# Patient Record
Sex: Male | Born: 2005 | Race: White | Hispanic: Yes | Marital: Single | Smoking: Never smoker
Health system: Southern US, Community
[De-identification: ages and names within clinical notes are randomized; demographics above are authoritative.]

## PROBLEM LIST (undated history)

## (undated) DIAGNOSIS — J45909 Unspecified asthma, uncomplicated: Secondary | ICD-10-CM

## (undated) DIAGNOSIS — R062 Wheezing: Secondary | ICD-10-CM

## (undated) DIAGNOSIS — J309 Allergic rhinitis, unspecified: Secondary | ICD-10-CM

## (undated) DIAGNOSIS — T7840XA Allergy, unspecified, initial encounter: Secondary | ICD-10-CM

## (undated) HISTORY — DX: Wheezing: R06.2

## (undated) HISTORY — DX: Allergy, unspecified, initial encounter: T78.40XA

## (undated) HISTORY — DX: Allergic rhinitis, unspecified: J30.9

## (undated) HISTORY — PX: TONSILLECTOMY: SUR1361

## (undated) HISTORY — DX: Unspecified asthma, uncomplicated: J45.909

---
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
## 2023-02-20 ENCOUNTER — Ambulatory Visit (INDEPENDENT_AMBULATORY_CARE_PROVIDER_SITE_OTHER): Payer: Medicaid Other | Admitting: Pediatrics

## 2023-02-20 ENCOUNTER — Other Ambulatory Visit: Payer: Self-pay

## 2023-02-20 VITALS — HR 78 | Temp 98.0°F | Wt 180.6 lb

## 2023-02-20 DIAGNOSIS — Q676 Pectus excavatum: Secondary | ICD-10-CM | POA: Diagnosis not present

## 2023-02-20 DIAGNOSIS — R6889 Other general symptoms and signs: Secondary | ICD-10-CM | POA: Diagnosis not present

## 2023-02-20 DIAGNOSIS — R0789 Other chest pain: Secondary | ICD-10-CM | POA: Diagnosis not present

## 2023-02-20 NOTE — Patient Instructions (Addendum)
Thanks for coming in [place].   These are a couple things we want to focus on for your chest: We are referring you to the hear specialist (Cardiologist) for evaluation with likely an EKG to look at your heart rhythm and echocardiogram (or ultrasound of your heart) -- you will get a call to schedule this appointment We are going to get a chest x-ray, we will call you with results Eliminate the following from your diet: Caffeine from any source (coffee, energy drinks, etc) Creatine Fast food Continue to avoid all tobacco products You may try the following medications to see if they help with the discomfort Tums (generic is Calcium Carbonate) 1-4 tablets as needed Prilosec(generic is Omeprazole) 10mg  tablet or capsule, once daily, trial for 4-8 weeks

## 2023-02-20 NOTE — Progress Notes (Signed)
History was provided by the patient.  Jack Anderson is a 17 y.o. male who is here for chest discomfort.     HPI:  Per Jack Anderson, started coffee/energy drinks last September. Had feelings of heart beating out of his chest anytime he would have caffeine. Energy drinks 4-6 times per month. Taking creatine every day since 2020.   Felt as if he was getting tired during warm-ups for basketball and this was not normal for him. His mile time went from 8-9 minutes to 13-15 minutes. Also feels like he is short of breath quicker. Has feel like his stamina is on a decline.   Also, when he ate certain foods, like fast food burgers, he would have discomfort in his chest. Was on slightly left side of chest underneath breastbone. Occur 3-4x No acid taste in his mouth. Would go away within minutes. Also would occur at nighttime. Seems to occur mostly on Friday nights, whether he goes to gym or not. Non-radiating. Not necessarily exacerbated by activity.   No syncopal episodes. Has had period where he was dizzy and he thought he was going to black out.   Has a history of pectus excavatum. Personal history of friend with similar symptoms and had to have surgery so concerned.   No recent unintended weight loss or gain. No difficulty or pain with swallowing. No vomiting. No diaphoretic episodes.   No tobacco/vaping, drugs, alcohol use.   Has been using a vacuum belt to try to improve his pectus. Has caused a bruise over chest.   The following portions of the patient's history were reviewed and updated as appropriate: allergies, current medications, past family history, past medical history, past social history, past surgical history, and problem list.  Patient Active Problem List   Diagnosis Date Noted   Acne 08/16/2019   Tinea pedis of right foot 08/16/2019   Pectus excavatum 08/16/2019   Influenza vaccination declined 03/10/2018    UTD imms  No Fhx of heart disease, high cholesterol,  stroke, MI, high blood pressure.   Physical Exam:  Pulse 78   Temp 98 F (36.7 C) (Oral)   Wt 180 lb 9.6 oz (81.9 kg)   SpO2 100%   Blood pressure %iles are not available for patients who are 18 years or older.  General: Awake, alert, appropriately responsive in NAD HEENT: NCAT. EOMI, PERRL, clear sclera and conjunctiva, corneal light reflex symmetric.Clear nares bilaterally. Oropharynx clear with no tonsillar enlargment or exudates. MMM.  Neck: Supple. No thyromegaly appreciated.  Lymph Nodes: No palpable lymphadenopathy.  CV: RRR, normal S1, S2. No murmur appreciated. 2+ distal pulses.  Chest: Mild sternal depression. Small amount of ecchymoses over depression.  Pulm: Normal WOB. CTAB with good aeration throughout.  No focal W/R/R.  Abd: Normoactive bowel sounds. Soft, non-tender, non-distended. No HSM appreciated. MSK: Extremities WWP. Moves all extremities equally.  Neuro: Appropriately responsive to stimuli. Normal bulk and tone. No gross deficits appreciated. Skin: No rashes or lesions appreciated. Cap refill < 2 seconds.      Assessment/Plan:   1. Chest discomfort 2. Exercise intolerance 3. Pectus excavatum 16yo M with PMH of mild pectus excavatum presenting with chronic 1-2 year history of nonspecific chest discomfort as well as worsening exercise intolerance. No evidence of worsening pectus on exam. May be element of reflux contributing to chest discomfort but does not explain developing exercise intolerance. Counseled on lifestyle changes as well as trial of PPI to determine if any improvement. Will also refer to Peds  Cardiology for further evaluation and likely EKG and Echo. Plan to obtain CXR to evaluate if any evidence of cardiomegaly, but reassuringly has a normal CV exam in clinic. Has a PMH of childhood asthma so could be element of exercise induced bronchospasm. Patient is also interested in cosmetic surgery for Pectus. No plan for bronchodilator trial. No indication  for chest CT at this time. Plan to continue to follow up after cardiology evaluation. May consider PFTs and trial of bronchodilator if no cardiovascular etiology.  Gave return to care precautions.   - Ambulatory referral to Pediatric Cardiology  - DG Chest 2 View; Future   J. Chestine Spore, MD, MPH [facility] - Primary Care PGY-2   02/20/23

## 2023-02-23 ENCOUNTER — Other Ambulatory Visit: Payer: Self-pay | Admitting: Pediatrics

## 2023-02-23 DIAGNOSIS — R6889 Other general symptoms and signs: Secondary | ICD-10-CM

## 2023-02-25 ENCOUNTER — Encounter: Payer: Self-pay | Admitting: Pediatrics

## 2023-02-25 ENCOUNTER — Ambulatory Visit
Admission: RE | Admit: 2023-02-25 | Discharge: 2023-02-25 | Disposition: A | Discharge status: Home / Self Care | Payer: Medicaid Other | Source: Ambulatory Visit | Attending: Pediatrics | Admitting: Pediatrics

## 2023-02-25 DIAGNOSIS — R0789 Other chest pain: Secondary | ICD-10-CM | POA: Diagnosis not present

## 2023-02-25 DIAGNOSIS — Q676 Pectus excavatum: Secondary | ICD-10-CM

## 2023-02-25 DIAGNOSIS — R6889 Other general symptoms and signs: Secondary | ICD-10-CM

## 2023-04-22 ENCOUNTER — Ambulatory Visit: Payer: Medicaid Other | Attending: Cardiology | Admitting: Cardiology

## 2023-04-22 ENCOUNTER — Encounter: Payer: Self-pay | Admitting: Cardiology

## 2023-04-22 VITALS — BP 96/58 | HR 82 | Ht 69.0 in | Wt 170.4 lb

## 2023-04-22 DIAGNOSIS — R002 Palpitations: Secondary | ICD-10-CM | POA: Diagnosis not present

## 2023-04-22 DIAGNOSIS — R6889 Other general symptoms and signs: Secondary | ICD-10-CM | POA: Diagnosis not present

## 2023-04-22 DIAGNOSIS — Q676 Pectus excavatum: Secondary | ICD-10-CM

## 2023-04-22 DIAGNOSIS — R0602 Shortness of breath: Secondary | ICD-10-CM | POA: Diagnosis not present

## 2023-04-22 NOTE — Patient Instructions (Signed)
Medication Instructions:  The current medical regimen is effective;  continue present plan and medications.  *If you need a refill on your cardiac medications before your next appointment, please call your pharmacy*  Testing/Procedures: Your physician has requested that you have an echocardiogram. Echocardiography is a painless test that uses sound waves to create images of your heart. It provides your doctor with information about the size and shape of your heart and how well your heart's chambers and valves are working. This procedure takes approximately one hour. There are no restrictions for this procedure. Please do NOT wear cologne, perfume, aftershave, or lotions (deodorant is allowed). Please arrive 15 minutes prior to your appointment time.  Follow-Up: At [facility], you and your health needs are our priority.  As part of our continuing mission to provide you with exceptional heart care, we have created designated Provider Care Teams.  These Care Teams include your primary Cardiologist (physician) and Advanced Practice Providers (APPs -  Physician Assistants and Nurse Practitioners) who all work together to provide you with the care you need, when you need it.  We recommend signing up for the patient portal called "MyChart".  Sign up information is provided on this After Visit Summary.  MyChart is used to connect with patients for Virtual Visits (Telemedicine).  Patients are able to view lab/test results, encounter notes, upcoming appointments, etc.  Non-urgent messages can be sent to your provider as well.   To learn more about what you can do with MyChart, go to [email or web address].    Your next appointment:   Follow up will be based on the results of the above testing.   

## 2023-04-22 NOTE — Progress Notes (Signed)
Cardiology Office Note:    Date:  04/22/2023   ID:  Jack Anderson, DOB [birth year], MRN [id number]  PCP:  Theadore Nan, MD   [facility] Providers Cardiologist:  None     Referring MD: Kathi Simpers, MD    History of Present Illness:    Jack Anderson is a 17 y.o. male here for the evaluation of chest discomfort exercise intolerance at the request of Dr. Kathlene November  In review of office visit from 02/20/2023, he had feelings of palpitations, heart beating out of his chest anytime he would have caffeine.  He would drink an energy drink maybe 4-6 times per month.  Been taking creatine also since 2020.  When playing basketball, felt quite tired during warm ups said this was not normal.  His mile time also declined; quite significant shortness of breath.  Stamina on decline.  When he would eat fast food, sometimes would have chest discomfort.  Left side underneath his breastbone.  Usually would go away within minutes.  Has pectus excavatum.  Had a friend with similar symptoms and then ended up needing surgery.   Past Medical History:  Diagnosis Date   Allergic rhinitis    Allergy    Phreesia 12/17/2020   Asthma    no meds 2010 to 01/2014   Wheezing 01/25/2014    Past Surgical History:  Procedure Laterality Date   APPENDECTOMY  1574   17 year old   TONSILLECTOMY      Current Medications: No outpatient medications have been marked as taking for the 04/22/23 encounter (Office Visit) with Jake Bathe, MD.     Allergies:   Amoxil [amoxicillin]   Social History   Socioeconomic History   Marital status: Single    Spouse name: Not on file   Number of children: Not on file   Years of education: Not on file   Highest education level: Not on file  Occupational History   Not on file  Tobacco Use   Smoking status: Never   Smokeless tobacco: Never  Substance and Sexual Activity   Alcohol use: Not on file   Drug use: Not on file    Sexual activity: Not on file  Other Topics Concern   Not on file  Social History Narrative   ** Merged History Encounter **       Social Determinants of Health   Financial Resource Strain: Not on file  Food Insecurity: Not on file  Transportation Needs: Not on file  Physical Activity: Not on file  Stress: Not on file  Social Connections: Not on file     Family History: The patient's family history is negative for Asthma.  ROS:   Please see the history of present illness.     All other systems reviewed and are negative.  EKGs/Labs/Other Studies Reviewed:    The following studies were reviewed today: EKG reviewed as below.  Prior office notes reviewed.  EKG:  The ekg ordered today demonstrates EKG Interpretation Date/Time:  Wednesday April 22 2023 16:11:07 EDT Ventricular Rate:  71 PR Interval:  174 QRS Duration:  98 QT Interval:  376 QTC Calculation: 408 R Axis:   215  Text Interpretation: Normal sinus rhythm Right superior axis deviation No previous ECGs available Confirmed by Donato Schultz ([id number]) on 04/22/2023 4:18:26 PM    Recent Labs: No results found for requested labs within last 365 days.  Recent Lipid Panel No results found for: "CHOL", "TRIG", "HDL", "CHOLHDL", "VLDL", "LDLCALC", "  LDLDIRECT"   Risk Assessment/Calculations:               Physical Exam:    VS:  BP (!) 96/58   Pulse 82   Ht 5\' 9"  (1.753 m)   Wt 170 lb 6.4 oz (77.3 kg)   SpO2 97%   BMI 25.16 kg/m     Wt Readings from Last 3 Encounters:  04/22/23 170 lb 6.4 oz (77.3 kg) (75%, Z= 0.68)*  02/20/23 180 lb 9.6 oz (81.9 kg) (84%, Z= 1.01)*  04/29/22 178 lb (80.7 kg) (85%, Z= 1.05)*   * Growth percentiles are based on CDC (Boys, 2-20 Years) data.     GEN:  Well nourished, well developed in no acute distress HEENT: Normal NECK: No JVD; No carotid bruits LYMPHATICS: No lymphadenopathy CARDIAC: RRR, no murmurs, rubs, gallops RESPIRATORY:  Clear to auscultation without rales,  wheezing or rhonchi  ABDOMEN: Soft, non-tender, non-distended MUSCULOSKELETAL:  No edema; No deformity  SKIN: Warm and dry NEUROLOGIC:  Alert and oriented x 3 PSYCHIATRIC:  Normal affect   ASSESSMENT:    1. Exercise intolerance   2. Pectus excavatum   3. Shortness of breath   4. Palpitations    PLAN:    In order of problems listed above:  Pectus excavatum Shortness of breath/decreased exercise tolerance -With pectus excavatum, lung restriction can take place resulting in decreased exercise tolerance.  We will check an echocardiogram to ensure proper structure and function of his heart as occasionally these can be present in the setting of pectus excavatum.  Based upon his symptoms, I would recommend further evaluation by thoracic surgery specialist, perhaps at [facility] for potential revision of pectus excavatum based upon his symptoms.  Palpitations - This has improved since decreasing energy drinks/caffeine.  He is also stopped creatine and has lost 10 pounds.  This was mostly water weight.  Chest discomfort - After eating burgers for instance.  Likely secondary to gastric reflux.  Could consider Pepcid.  This appears noncardiac.          Medication Adjustments/Labs and Tests Ordered: Current medicines are reviewed at length with the patient today.  Concerns regarding medicines are outlined above.  Orders Placed This Encounter  Procedures   EKG 12-Lead   ECHOCARDIOGRAM COMPLETE   No orders of the defined types were placed in this encounter.   Patient Instructions  Medication Instructions:  The current medical regimen is effective;  continue present plan and medications.  *If you need a refill on your cardiac medications before your next appointment, please call your pharmacy*  Testing/Procedures: Your physician has requested that you have an echocardiogram. Echocardiography is a painless test that uses sound waves to create images of your heart. It provides your doctor  with information about the size and shape of your heart and how well your heart's chambers and valves are working. This procedure takes approximately one hour. There are no restrictions for this procedure. Please do NOT wear cologne, perfume, aftershave, or lotions (deodorant is allowed). Please arrive 15 minutes prior to your appointment time.  Follow-Up: At [facility], you and your health needs are our priority.  As part of our continuing mission to provide you with exceptional heart care, we have created designated Provider Care Teams.  These Care Teams include your primary Cardiologist (physician) and Advanced Practice Providers (APPs -  Physician Assistants and Nurse Practitioners) who all work together to provide you with the care you need, when you need it.  We recommend  signing up for the patient portal called "MyChart".  Sign up information is provided on this After Visit Summary.  MyChart is used to connect with patients for Virtual Visits (Telemedicine).  Patients are able to view lab/test results, encounter notes, upcoming appointments, etc.  Non-urgent messages can be sent to your provider as well.   To learn more about what you can do with MyChart, go to [email or web address].    Your next appointment:   Follow up will be based on the results of the above testing.   Signed, Donato Schultz, MD  04/22/2023 4:45 PM    [facility]

## 2023-04-27 ENCOUNTER — Encounter: Payer: Self-pay | Admitting: Pediatrics

## 2023-04-27 ENCOUNTER — Ambulatory Visit (INDEPENDENT_AMBULATORY_CARE_PROVIDER_SITE_OTHER): Payer: Medicaid Other | Admitting: Pediatrics

## 2023-04-27 VITALS — BP 118/78 | Wt 169.1 lb

## 2023-04-27 DIAGNOSIS — R0789 Other chest pain: Secondary | ICD-10-CM | POA: Diagnosis not present

## 2023-04-27 DIAGNOSIS — R6889 Other general symptoms and signs: Secondary | ICD-10-CM

## 2023-04-27 DIAGNOSIS — Q676 Pectus excavatum: Secondary | ICD-10-CM

## 2023-04-27 DIAGNOSIS — J4520 Mild intermittent asthma, uncomplicated: Secondary | ICD-10-CM | POA: Diagnosis not present

## 2023-04-27 MED ORDER — SYMBICORT 80-4.5 MCG/ACT IN AERO: 2.0000 | INHALATION_SPRAY | Freq: Two times a day (BID) | RESPIRATORY_TRACT | 12 refills | Status: AC

## 2023-04-27 NOTE — Progress Notes (Signed)
Subjective:     Jack Anderson, is a 17 y.o. male  HPI  Chief Complaint  Patient presents with   Follow-up    Patient states he has changed his diet to no caffeine and said he is feeling much better     Presented to the clinic in June with decreased exercise tolerance, chest discomfort, symptoms of GE reflux and palpitation.  Evaluation has included chest x-ray and cardiology has not yet had his echo The palpitations stop with decrease in the energy drinks No creatine No energy drinks Pain in chest with eating; No more pain in chest after eating  Endurance is still an issues Ultrasound--scheduled for end of August  Mile time used to be 7-8 minutes Mile time 13 minutes Still run 30 min 3 times a week Was ding more (also had basketball)   No cough with running or after running,  No chest pain while running 2019 treated for fatigue and cough with Albuterol -  He is very self-conscious about his pectus excavatum He wants it repaired by one of the top 3-5 surgeons in the world He would consider leaving the country to have it done He says the best surgeon in the countries in [place] He says his parents say his concerns on his head and that they would not think he should have surgery He has been researching surgical procedures on the Internet and he has a friend who had in his 30s.  Seasonal allergies Rare, occasional runny nose or eye if heavy pollen  Graduate high school  History and Problem List: Jack Anderson has Influenza vaccination declined; Acne; Tinea pedis of right foot; and Pectus excavatum on their problem list.  Jack Anderson  has a past medical history of Allergic rhinitis, Allergy, Asthma, and Wheezing (01/25/2014).     Objective:     BP 118/78 (BP Location: Left Arm)   Wt 169 lb 2 oz (76.7 kg)   BMI 24.98 kg/m   Physical Exam Constitutional:      Appearance: He is normal weight.     Comments: Very muscular  HENT:     Head: Normocephalic.      Mouth/Throat:     Mouth: Mucous membranes are moist.     Pharynx: Oropharynx is clear.  Cardiovascular:     Heart sounds: Normal heart sounds. No murmur heard. Pulmonary:     Effort: Pulmonary effort is normal.     Breath sounds: Normal breath sounds.  Chest:     Comments: Moderate pectus excavatum Abdominal:     General: Abdomen is flat. There is no distension.     Tenderness: There is no abdominal tenderness.  Skin:    Coloration: Skin is not pale.  Neurological:     Mental Status: He is alert.        Assessment & Plan:   1. Exercise intolerance  He says that he has not changed his workout routines or his running schedule based decrease in his running time. He has a history of asthma that was actively treated recently as 2019.  Trial of Symbicort 2 puffs twice a day with a spacer He can also use his Symbicort before exercise  - budesonide-formoterol (SYMBICORT) 80-4.5 MCG/ACT inhaler; Inhale 2 puffs into the lungs 2 (two) times daily.  Dispense: 10.2 g; Refill: 12  2. Pectus excavatum  Discussed with patient that his insurance would not cover surgery outside of [place]. All surgery has risks of pain and infection It is unlikely that his  pectus is causing any significant change in his exercise tolerance as his pectus is not changed much over the last several years. Okay to refer to thoracic surgery, but he should make sure his insurance will pay for the visit and to be clear that his wants to discuss repair of his pectus  - Ambulatory referral to Cardiothoracic Surgery  3. Chest discomfort He is no longer having palpitations now but is stopped taking creatine and energy drinks. He is also no longer having the symptoms that seem consistent with heartburn   Supportive care and return precautions reviewed.  Time spent reviewing chart in preparation for visit:  5 minutes Time spent face-to-face with patient: 15 minutes Time spent not face-to-face with  patient for documentation and care coordination on date of service: 5 minutes   Theadore Nan, MD

## 2023-04-27 NOTE — Patient Instructions (Signed)
Adult Primary Care Clinics Name Criteria Services   [facility]  Address: [address] [place], [place] [address]  Phone: [phone] Hours: Monday - Friday 9 AM -6 PM  Types of insurance accepted:  Commercial insurance [facility] (orange card) Medicaid Medicare Uninsured  Language services:  Video and phone interpreters available   Ages 18 and older    Adult primary care Onsite pharmacy Integrated behavioral health Financial assistance counseling Walk-in hours for established patients  Financial assistance counseling hours: Tuesdays 2:00PM - 5:00PM  Thursday 8:30AM - 4:30PM  Space is limited, 10 on Tuesday and 20 on Thursday. It's on first come first serve basis  Name Criteria Services   [facility]  Address: [address] [place], [place] [address]  Phone: [phone]  Hours: Monday - Friday 8:30 AM - 5 PM  Types of insurance accepted:  Commercial insurance Medicaid Medicare Uninsured  Language services:  Video and phone interpreters available   All ages - newborn to adult   Primary care for all ages (children and adults) Integrated behavioral health Nutritionist Financial assistance counseling   Name Criteria Services   [facility]  Located on the ground floor of [facility]  Address: [address]  [place],  [place]  [address]  Phone: [phone]  Hours: Monday - Friday 8:15 AM - 5 PM  Types of insurance accepted:  Commercial insurance Medicaid Medicare Uninsured  Language services:  Video and phone interpreters available   Ages 18 and older   Adult primary care Nutritionist Certified Diabetes Educator  Integrated behavioral health Financial assistance counseling   Name Criteria Services   [facility]  Address: [address] [place], [place] [address]  Phone:  [phone]  Hours: Monday - Friday 8:30 AM - 5 PM    Types of insurance accepted:  Commercial insurance Medicaid Medicare Uninsured  Language services:  Video and phone interpreters available   All ages - newborn to adult   Primary care for all ages (children and adults) Integrated behavioral health Financial assistance counseling      

## 2023-05-11 ENCOUNTER — Ambulatory Visit (HOSPITAL_COMMUNITY): Payer: Medicaid Other | Attending: Cardiology

## 2023-05-11 DIAGNOSIS — R6889 Other general symptoms and signs: Secondary | ICD-10-CM | POA: Insufficient documentation

## 2023-05-11 DIAGNOSIS — R0609 Other forms of dyspnea: Secondary | ICD-10-CM | POA: Diagnosis not present

## 2023-05-11 DIAGNOSIS — Q676 Pectus excavatum: Secondary | ICD-10-CM | POA: Insufficient documentation

## 2023-05-11 LAB — ECHOCARDIOGRAM COMPLETE
Area-P 1/2: 4.1 cm2
Est EF: >75
S' Lateral: 2.8 cm

## 2023-05-14 ENCOUNTER — Encounter: Payer: Self-pay | Admitting: Pediatrics

## 2023-06-15 ENCOUNTER — Encounter: Payer: Self-pay | Admitting: Pediatrics

## 2023-06-15 ENCOUNTER — Ambulatory Visit (INDEPENDENT_AMBULATORY_CARE_PROVIDER_SITE_OTHER): Payer: Medicaid Other | Admitting: Pediatrics

## 2023-06-15 ENCOUNTER — Other Ambulatory Visit (HOSPITAL_COMMUNITY)
Admission: RE | Admit: 2023-06-15 | Discharge: 2023-06-15 | Disposition: A | Discharge status: Home / Self Care | Payer: Medicaid Other | Source: Ambulatory Visit | Attending: Pediatrics | Admitting: Pediatrics

## 2023-06-15 VITALS — BP 110/60 | HR 74 | Ht 68.82 in | Wt 171.1 lb

## 2023-06-15 DIAGNOSIS — Z113 Encounter for screening for infections with a predominantly sexual mode of transmission: Secondary | ICD-10-CM | POA: Insufficient documentation

## 2023-06-15 DIAGNOSIS — Z1389 Encounter for screening for other disorder: Secondary | ICD-10-CM | POA: Diagnosis not present

## 2023-06-15 DIAGNOSIS — Z1331 Encounter for screening for depression: Secondary | ICD-10-CM

## 2023-06-15 DIAGNOSIS — Q676 Pectus excavatum: Secondary | ICD-10-CM | POA: Diagnosis not present

## 2023-06-15 DIAGNOSIS — Z6825 Body mass index (BMI) 25.0-25.9, adult: Secondary | ICD-10-CM | POA: Diagnosis not present

## 2023-06-15 DIAGNOSIS — Z114 Encounter for screening for human immunodeficiency virus [HIV]: Secondary | ICD-10-CM

## 2023-06-15 DIAGNOSIS — Z0001 Encounter for general adult medical examination with abnormal findings: Secondary | ICD-10-CM | POA: Diagnosis not present

## 2023-06-15 DIAGNOSIS — Z00129 Encounter for routine child health examination without abnormal findings: Secondary | ICD-10-CM | POA: Diagnosis not present

## 2023-06-15 DIAGNOSIS — Z0000 Encounter for general adult medical examination without abnormal findings: Secondary | ICD-10-CM | POA: Diagnosis not present

## 2023-06-15 DIAGNOSIS — E663 Overweight: Secondary | ICD-10-CM | POA: Diagnosis not present

## 2023-06-15 LAB — POCT RAPID HIV: Rapid HIV, POC: NEGATIVE

## 2023-06-15 NOTE — Progress Notes (Signed)
Adolescent Well Care Visit Jack Anderson is a 17 y.o. male who is here for well care.    PCP:  Jack Nan, MD  Interpreter used: no   History was provided by the patient.  Current Issues:  .   Last well visit 07/2021  Meeting a new adult doctor--something [facility]  He called [facility] Surgery at [facility], he wants pectus repair although his parents are discouraging it. --the surgeons said that they didn't have a referral although it is completed in our chart   Regarding chest discomfort No more Pepcid  Tried Symbicort and no change Not having coughing, just having chest tightness--does have hx of childhood asthma   Saw Cardiology "everything fine " 8/7 EKG ordered, results not found Echo normal   Stopped caffeine--was using energy drinks and having palpitations  Stopped creatine  Nutrition: Current Diet: eats healthy, fruit and veg, and calcium  Exercise/ Media: Sports?/ Exercise: lifting and cardio (treadmill)  Media: hours per day: tries to limit Media Rules or Monitoring?: yes  Sleep:  Sleep: no problems Problems Sleeping: No  Social Screening: Lives with:  family:  Jack Anderson 14, Jack Anderson is 2 Interests/ Activities: guitar, hang with friends Work, and Regulatory affairs officer?: not working Concerns regarding behavior? no   Education: School Name and Grade: a Holiday representative in college Was in [employer],  At [employer],  Biology major--want to go to dental school  Problems: none  Dental Patient has a dental home: yes  Confidential Social History: Tobacco?  no Cannabis? no Alcohol? no Was dating someone, a woman Condoms, plan B  Sexually Active?  Previously, not currently     Screenings: The patient completed the Rapid Assessment for Adolescent Preventive Services screening questionnaire and the following topics were identified as risk factors and discussed:  supplement use    PHQ-9, modified for Adolescents  completed and results indicated  low risk score of 0  Physical Exam:  Vitals:   06/15/23 0905  BP: 110/60  Pulse: 74  SpO2: 98%  Weight: 171 lb 2 oz (77.6 kg)  Height: 5' 8.82" (1.748 m)   BP 110/60 (BP Location: Left Arm, Patient Position: Sitting, Cuff Size: Normal)   Pulse 74   Ht 5' 8.82" (1.748 m)   Wt 171 lb 2 oz (77.6 kg)   SpO2 98%   BMI 25.40 kg/m  Body mass index: body mass index is 25.4 kg/m. Blood pressure %iles are not available for patients who are 18 years or older.  Hearing Screening   500Hz  1000Hz  2000Hz  4000Hz   Right ear 20 20 20 20   Left ear 20 20 20 20    Vision Screening   Right eye Left eye Both eyes  Without correction 20/16 20/16 20/16   With correction       General Appearance:   alert, oriented, no acute distress  HENT: Normocephalic, no obvious abnormality, conjunctiva clear  Mouth:   Normal appearing teeth,no  untreated dental caries,   Neck:   Supple; thyroid: no enlargement, symmetric, no tenderness/mass/nodules  Chest pectus  Lungs:   Clear to auscultation bilaterally, normal work of breathing  Heart:   Regular rate and rhythm, S1 and S2 normal, no murmurs;   Abdomen:   Soft, non-tender, no mass, or organomegaly  GU normal male genitals, no testicular masses or hernia  Musculoskeletal:   Tone and strength strong and symmetrical, all extremities               Lymphatic:   No  cervical adenopathy  Skin/Hair/Nails:   Skin warm, dry and intact, no rashes, no bruises or petechiae  Skin-Acne:  Mild acne face and chest  Neurologic:   Strength, gait, and coordination normal and age-appropriate     Assessment and Plan:   1. Encounter for routine child health examination without abnormal findings   2. Screening examination for venereal disease pending - Urine cytology ancillary only  3. Screening for human immunodeficiency virus negative - POCT Rapid HIV  4. Overweight with body mass index (BMI) of 25 to 25.9 in adult Has athletic build  Pectus --previously  referred to CT surgery fot consultation Wil ask referral coordinator to assist He can also  to call CT surgery to FU on referral   Growth: Appropriate growth for age  BMI is appropriate for age  Concerns regarding school: No  Concerns regarding home: No  Hearing screening result:normal Vision screening result: normal  Imm UTD   FU at adult primary care   Jack Nan, MD

## 2023-06-15 NOTE — Patient Instructions (Addendum)
Please call Patient referred to [facility] Cardiothoracic S[facility]-   [facility] [place], [place], [place] [address] p. [phone] Edilia Bo [phone]   Please call our office, [facility]chelle Folks, if still having difficulty with referral

## 2023-06-16 LAB — URINE CYTOLOGY ANCILLARY ONLY
Chlamydia: NEGATIVE
Comment: NEGATIVE
Comment: NORMAL
Neisseria Gonorrhea: NEGATIVE

## 2023-06-18 NOTE — Addendum Note (Signed)
Addended by: Theadore Nan on: 06/18/2023 03:55 PM   Modules accepted: Orders

## 2023-07-09 ENCOUNTER — Ambulatory Visit
Admission: EM | Admit: 2023-07-09 | Discharge: 2023-07-09 | Disposition: A | Discharge status: Home / Self Care | Payer: Medicaid Other | Attending: Emergency Medicine | Admitting: Emergency Medicine

## 2023-07-09 DIAGNOSIS — J302 Other seasonal allergic rhinitis: Secondary | ICD-10-CM | POA: Diagnosis not present

## 2023-07-09 DIAGNOSIS — J029 Acute pharyngitis, unspecified: Secondary | ICD-10-CM | POA: Diagnosis not present

## 2023-07-09 LAB — POCT RAPID STREP A (OFFICE): Rapid Strep A Screen: NEGATIVE

## 2023-07-09 MED ORDER — CETIRIZINE HCL 10 MG PO TABS: 10.0000 mg | ORAL_TABLET | Freq: Every day | ORAL | 0 refills | Status: AC

## 2023-07-09 NOTE — ED Triage Notes (Signed)
Pt reports sore throat the last three mornings, goes away in the afternoon. Also has slightly runny nose these times as well.  Denies fever, cough, SOB, etc.

## 2023-07-09 NOTE — Discharge Instructions (Addendum)
Take Zyrtec as directed.    Take Tylenol or ibuprofen as needed for discomfort.    Follow-up with your primary care provider if your symptoms are not improving.

## 2023-07-09 NOTE — ED Provider Notes (Signed)
[facility] Jack Anderson    CSN: [id number] Arrival date & time: 07/09/23  1020      History   Chief Complaint Chief Complaint  Patient presents with   Sore Throat    Intermittent x 3 days     HPI Jack Anderson is a 17 y.o. male.  Patient presents with 3-day history of sore throat and runny nose.  He denies fever, rash, cough, shortness of breath, or other symptoms.  No OTC medications taken.  The history is provided by the patient and medical records.    Past Medical History:  Diagnosis Date   Allergic rhinitis    Allergy    [employer] 12/17/2020   Asthma    no meds 2010 to 01/2014   Wheezing 01/25/2014    Patient Active Problem List   Diagnosis Date Noted   Acne 08/16/2019   Tinea pedis of right foot 08/16/2019   Pectus excavatum 08/16/2019   Influenza vaccination declined 03/10/2018    Past Surgical History:  Procedure Laterality Date   APPENDECTOMY  6043   17 year old   TONSILLECTOMY         Home Medications    Prior to Admission medications   Medication Sig Start Date End Date Taking? Authorizing Provider  cetirizine (ZYRTEC ALLERGY) 10 MG tablet Take 1 tablet (10 mg total) by mouth daily for 14 days. 07/09/23 07/23/23 Yes Mickie Bail, NP  budesonide-formoterol (SYMBICORT) 80-4.5 MCG/ACT inhaler Inhale 2 puffs into the lungs 2 (two) times daily. Patient not taking: Reported on 06/15/2023 04/27/23   Theadore Nan, MD    Family History Family History  Problem Relation Age of Onset   Asthma Neg Hx     Social History Social History   Tobacco Use   Smoking status: Never   Smokeless tobacco: Never     Allergies   Amoxil [amoxicillin]   Review of Systems Review of Systems  Constitutional:  Negative for chills and fever.  HENT:  Positive for rhinorrhea and sore throat. Negative for ear pain.   Respiratory:  Negative for cough and shortness of breath.   Skin:  Negative for color change and rash.     Physical Exam Triage  Vital Signs ED Triage Vitals [07/09/23 1132]  Encounter Vitals Group     BP 110/64     Systolic BP Percentile      Diastolic BP Percentile      Pulse Rate 72     Resp 18     Temp 98 F (36.7 C)     Temp src      SpO2 97 %     Weight      Height      Head Circumference      Peak Flow      Pain Score      Pain Loc      Pain Education      Exclude from Growth Chart    No data found.  Updated Vital Signs BP 110/64   Pulse 72   Temp 98 F (36.7 C)   Resp 18   SpO2 97%   Visual Acuity Right Eye Distance:   Left Eye Distance:   Bilateral Distance:    Right Eye Near:   Left Eye Near:    Bilateral Near:     Physical Exam Constitutional:      General: He is not in acute distress. HENT:     Right Ear: Tympanic membrane normal.  Left Ear: Tympanic membrane normal.     Nose: Nose normal.     Mouth/Throat:     Mouth: Mucous membranes are moist.     Pharynx: Posterior oropharyngeal erythema present.     Comments: Clear PND. Cardiovascular:     Rate and Rhythm: Normal rate and regular rhythm.     Heart sounds: Normal heart sounds.  Pulmonary:     Effort: Pulmonary effort is normal. No respiratory distress.     Breath sounds: Normal breath sounds.  Neurological:     Mental Status: He is alert.      UC Treatments / Results  Labs (all labs ordered are listed, but only abnormal results are displayed) Labs Reviewed  POCT RAPID STREP A (OFFICE)    EKG   Radiology No results found.  Procedures Procedures (including critical care time)  Medications Ordered in UC Medications - No data to display  Initial Impression / Assessment and Plan / UC Course  I have reviewed the triage vital signs and the nursing notes.  Pertinent labs & imaging results that were available during my care of the patient were reviewed by me and considered in my medical decision making (see chart for details).    Sore throat, seasonal allergies.  Rapid strep negative.  Afebrile  and vital signs are stable.  Treating with Zyrtec.  Tylenol or ibuprofen as needed.  Education provided on sore throat and allergic rhinitis.  Instructed patient to follow-up with his PCP if he is not improving.  He agrees to plan of care.  Final Clinical Impressions(s) / UC Diagnoses   Final diagnoses:  Sore throat  Seasonal allergies     Discharge Instructions      Take Zyrtec as directed.    Take Tylenol or ibuprofen as needed for discomfort.    Follow-up with your primary care provider if your symptoms are not improving.      ED Prescriptions     Medication Sig Dispense Auth. Provider   cetirizine (ZYRTEC ALLERGY) 10 MG tablet Take 1 tablet (10 mg total) by mouth daily for 14 days. 14 tablet Mickie Bail, NP      PDMP not reviewed this encounter.   Mickie Bail, NP 07/09/23 [phone]

## 2023-07-30 ENCOUNTER — Ambulatory Visit
Admission: EM | Admit: 2023-07-30 | Discharge: 2023-07-30 | Disposition: A | Discharge status: Home / Self Care | Payer: Medicaid Other | Attending: Emergency Medicine | Admitting: Emergency Medicine

## 2023-07-30 DIAGNOSIS — S8002XA Contusion of left knee, initial encounter: Secondary | ICD-10-CM | POA: Diagnosis not present

## 2023-07-30 DIAGNOSIS — M25561 Pain in right knee: Secondary | ICD-10-CM

## 2023-07-30 DIAGNOSIS — S5001XA Contusion of right elbow, initial encounter: Secondary | ICD-10-CM | POA: Diagnosis not present

## 2023-07-30 MED ORDER — NAPROXEN 500 MG PO TABS: 500.0000 mg | ORAL_TABLET | Freq: Two times a day (BID) | ORAL | 0 refills | Status: AC

## 2023-07-30 MED ORDER — TIZANIDINE HCL 4 MG PO TABS: 4.0000 mg | ORAL_TABLET | Freq: Three times a day (TID) | ORAL | 0 refills | Status: AC | PRN

## 2023-07-30 NOTE — Discharge Instructions (Signed)
Take the Naprosyn with 1000 mg of Tylenol twice a day.  Ice your knee.  Zanaflex in case you start to have muscle spasms.   Some people may require physical therapy. Early range of motion neck exercises has been shown to speed recovery. Start doing them as soon as possible. Start doing small range and amplitude movements of your neck, first in one direction, then the other. Repeat this 10 times in each direction every hour while awake. Do these to the maximum comfortable range. You may do this sitting up or lying down.  Go to www.goodrx.com  or www.costplusdrugs.com to look up your medications. This will give you a list of where you can find your prescriptions at the most affordable prices. Or ask the pharmacist what the cash price is, or if they have any other discount programs available to help make your medication more affordable. This can be less expensive than what you would pay with insurance.
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
